# Patient Record
Sex: Female | Born: 1990 | Hispanic: Yes | Marital: Married | Smoking: Current every day smoker
Health system: Southern US, Community
[De-identification: ages and names within clinical notes are randomized; demographics above are authoritative.]

---

## 2016-08-21 ENCOUNTER — Ambulatory Visit (INDEPENDENT_AMBULATORY_CARE_PROVIDER_SITE_OTHER): Payer: Self-pay | Admitting: Physician Assistant

## 2016-08-23 ENCOUNTER — Emergency Department (HOSPITAL_COMMUNITY)
Admission: EM | Admit: 2016-08-23 | Discharge: 2016-08-24 | Disposition: A | Discharge status: Home / Self Care | Payer: Self-pay | Attending: Emergency Medicine | Admitting: Emergency Medicine

## 2016-08-23 ENCOUNTER — Encounter (HOSPITAL_COMMUNITY): Payer: Self-pay | Admitting: Emergency Medicine

## 2016-08-23 DIAGNOSIS — R200 Anesthesia of skin: Secondary | ICD-10-CM

## 2016-08-23 LAB — CBC WITH DIFFERENTIAL/PLATELET
Basophils Absolute: 0 10*3/uL (ref 0.0–0.1)
Basophils Relative: 0 %
EOS ABS: 0.1 10*3/uL (ref 0.0–0.7)
EOS PCT: 1 %
HCT: 36.6 % (ref 36.0–46.0)
HEMOGLOBIN: 11.7 g/dL — AB (ref 12.0–15.0)
Lymphocytes Relative: 43 %
Lymphs Abs: 5.3 10*3/uL — ABNORMAL HIGH (ref 0.7–4.0)
MCH: 26.6 pg (ref 26.0–34.0)
MCHC: 32 g/dL (ref 30.0–36.0)
MCV: 83.2 fL (ref 78.0–100.0)
Monocytes Absolute: 0.7 10*3/uL (ref 0.1–1.0)
Monocytes Relative: 6 %
NEUTROS PCT: 50 %
Neutro Abs: 6.3 10*3/uL (ref 1.7–7.7)
PLATELETS: 385 10*3/uL (ref 150–400)
RBC: 4.4 MIL/uL (ref 3.87–5.11)
RDW: 14.5 % (ref 11.5–15.5)
WBC: 12.4 10*3/uL — AB (ref 4.0–10.5)

## 2016-08-23 LAB — BASIC METABOLIC PANEL
Anion gap: 6 (ref 5–15)
BUN: 7 mg/dL (ref 6–20)
CHLORIDE: 105 mmol/L (ref 101–111)
CO2: 25 mmol/L (ref 22–32)
CREATININE: 0.66 mg/dL (ref 0.44–1.00)
Calcium: 9 mg/dL (ref 8.9–10.3)
GFR calc Af Amer: >60 mL/min (ref >60)
GFR calc non Af Amer: >60 mL/min (ref >60)
Glucose, Bld: 188 mg/dL — ABNORMAL HIGH (ref 65–99)
POTASSIUM: 3.6 mmol/L (ref 3.5–5.1)
SODIUM: 136 mmol/L (ref 135–145)

## 2016-08-23 LAB — URINALYSIS, ROUTINE W REFLEX MICROSCOPIC
BILIRUBIN URINE: NEGATIVE
Glucose, UA: NEGATIVE mg/dL
KETONES UR: NEGATIVE mg/dL
Leukocytes, UA: NEGATIVE
Nitrite: NEGATIVE
PH: 6 (ref 5.0–8.0)
PROTEIN: NEGATIVE mg/dL
Specific Gravity, Urine: 1.023 (ref 1.005–1.030)

## 2016-08-23 LAB — POC URINE PREG, ED: PREG TEST UR: NEGATIVE

## 2016-08-23 LAB — CBG MONITORING, ED: GLUCOSE-CAPILLARY: 116 mg/dL — AB (ref 65–99)

## 2016-08-23 NOTE — ED Triage Notes (Signed)
Pt c/o right side numbness since this morning, denies any pain, no neuro deficit noticed on triage.

## 2016-08-24 NOTE — Discharge Instructions (Signed)

## 2016-08-24 NOTE — ED Provider Notes (Signed)
[facility]-EMERGENCY DEPT Provider Note   CSN: [id number] Arrival date & time: 08/23/16  2050     History   Chief Complaint Chief Complaint  Patient presents with  . Numbness    right side.    HPI Sandra Ingram is a 26 y.o. female.  The history is provided by the patient.  Neurologic Problem  This is a new problem. The current episode started 2 days ago. The problem occurs daily. The problem has been gradually improving. Associated symptoms include headaches. Pertinent negatives include no chest pain, no abdominal pain and no shortness of breath. Nothing aggravates the symptoms. Nothing relieves the symptoms.  pt reports onset of unilateral right sided numbness about 2 days ago It became worse prior to arrival but now is improving She reports it all started in the toes of her right foot and then progressed to rest of leg/right arm/face She has had mild HA No acute visual changes No fever/vomiting She has felt some weakness in extremities, none at this time Family reports earlier her voice "was lower" and her face may have been assymetric but now all resolved No h/o stroke She reports mild low back pain No other medical conditions   PMH - none OB History    No data available       Home Medications    Prior to Admission medications   Not on File    Family History History reviewed. No pertinent family history.  Social History Social History  Substance Use Topics  . Smoking status: Never Smoker  . Smokeless tobacco: Never Used  . Alcohol use No     Allergies   Patient has no known allergies.   Review of Systems Review of Systems  Constitutional: Negative for fever.  Respiratory: Negative for shortness of breath.   Cardiovascular: Negative for chest pain.  Gastrointestinal: Negative for abdominal pain.  Neurological: Positive for weakness, numbness and headaches.  All other systems reviewed and are negative.    Physical Exam Updated Vital Signs BP  121/80   Pulse (!) 102   Temp 97.8 F (36.6 C) (Oral)   Resp 18   Ht 1.626 m (5\' 4" )   Wt 107.7 kg (237 lb 8 oz)   LMP 05/14/2016   SpO2 98%   BMI 40.77 kg/m   Physical Exam CONSTITUTIONAL: Well developed/well nourished HEAD: Normocephalic/atraumatic EYES: EOMI/PERRL, no nystagmus ENMT: Mucous membranes moist NECK: supple no meningeal signs, no bruits CV: S1/S2 noted, no murmurs/rubs/gallops noted LUNGS: Lungs are clear to auscultation bilaterally, no apparent distress ABDOMEN: soft, nontender, no rebound or guarding GU:no cva tenderness NEURO:Awake/alert, face symmetric, no arm or leg drift is noted Equal 5/5 strength with hip flexion,knee flex/extension, foot dorsi/plantar flexion Cranial nerves 3/4/5/6/07/30/08/11/12 tested and intact Gait normal without ataxia No past pointing Sensation to light touch intact in all extremities EXTREMITIES: pulses normal, full ROM SKIN: warm, color normal PSYCH: no abnormalities of mood noted   ED Treatments / Results  Labs (all labs ordered are listed, but only abnormal results are displayed) Labs Reviewed  CBC WITH DIFFERENTIAL/PLATELET - Abnormal; Notable for the following:       Result Value   WBC 12.4 (*)    Hemoglobin 11.7 (*)    Lymphs Abs 5.3 (*)    All other components within normal limits  BASIC METABOLIC PANEL - Abnormal; Notable for the following:    Glucose, Bld 188 (*)    All other components within normal limits  URINALYSIS, ROUTINE W REFLEX MICROSCOPIC - Abnormal;  Notable for the following:    APPearance HAZY (*)    Hgb urine dipstick SMALL (*)    Bacteria, UA RARE (*)    Squamous Epithelial / LPF 0-5 (*)    All other components within normal limits  CBG MONITORING, ED - Abnormal; Notable for the following:    Glucose-Capillary 116 (*)    All other components within normal limits  POC URINE PREG, ED    EKG  EKG Interpretation  Date/Time:  Thursday August 23 2016 23:24:52 EDT Ventricular Rate:  92 PR  Interval:    QRS Duration: 74 QT Interval:  334 QTC Calculation: 414 R Axis:   61 Text Interpretation:  Sinus rhythm Low voltage, precordial leads Abnormal Q suggests anterior infarct Interpretation limited secondary to artifact No previous ECGs available Confirmed by Zadie RhineWickline, Ileene Allie ([id number]) on 08/24/2016 12:23:23 AM       Radiology No results found.  Procedures Procedures    Medications Ordered in ED Medications - No data to display   Initial Impression / Assessment and Plan / ED Course  I have reviewed the triage vital signs and the nursing notes.  Pertinent labs   results that were available during my care of the patient were reviewed by me and considered in my medical decision making (see chart for details).     Pt stable No focal neuro deficits on my evaluation However, reported h/o numbness/?facial weakness and speech changes over past 2 days I advised to fully rule out stroke, we should proceed with MRI brain She declines and is requesting discharge home She reports she will come back to the ED later in the day for evaluation She was also referred to neurology as outpatient We discussed strict ER return precautions  Final Clinical Impressions(s) / ED Diagnoses   Final diagnoses:  Numbness    New Prescriptions There are no discharge medications for this patient.    Zadie RhineWickline, Kali Ambler, MD 08/24/16 [phone]

## 2016-08-24 NOTE — ED Notes (Signed)
Pt departed in NAD, refused use of wheelchair.  

## 2016-09-11 ENCOUNTER — Ambulatory Visit (INDEPENDENT_AMBULATORY_CARE_PROVIDER_SITE_OTHER): Payer: Self-pay | Admitting: Physician Assistant

## 2016-09-11 ENCOUNTER — Encounter (INDEPENDENT_AMBULATORY_CARE_PROVIDER_SITE_OTHER): Payer: Self-pay | Admitting: Physician Assistant

## 2016-09-11 VITALS — BP 100/69 | HR 93 | Temp 98.2°F | Resp 18 | Ht 65.0 in | Wt 240.0 lb

## 2016-09-11 DIAGNOSIS — L680 Hirsutism: Secondary | ICD-10-CM

## 2016-09-11 DIAGNOSIS — N921 Excessive and frequent menstruation with irregular cycle: Secondary | ICD-10-CM

## 2016-09-11 DIAGNOSIS — R202 Paresthesia of skin: Secondary | ICD-10-CM

## 2016-09-11 DIAGNOSIS — E669 Obesity, unspecified: Secondary | ICD-10-CM

## 2016-09-11 DIAGNOSIS — D72829 Elevated white blood cell count, unspecified: Secondary | ICD-10-CM

## 2016-09-11 DIAGNOSIS — D649 Anemia, unspecified: Secondary | ICD-10-CM

## 2016-09-11 DIAGNOSIS — R739 Hyperglycemia, unspecified: Secondary | ICD-10-CM

## 2016-09-11 DIAGNOSIS — R7303 Prediabetes: Secondary | ICD-10-CM

## 2016-09-11 LAB — POCT URINALYSIS DIPSTICK
Bilirubin, UA: NEGATIVE
Glucose, UA: NEGATIVE
KETONES UA: NEGATIVE
Leukocytes, UA: NEGATIVE
Nitrite, UA: NEGATIVE
PH UA: 6 (ref 5.0–8.0)
SPEC GRAV UA: 1.025 (ref 1.010–1.025)
UROBILINOGEN UA: 0.2 U/dL

## 2016-09-11 LAB — POCT GLYCOSYLATED HEMOGLOBIN (HGB A1C): Hemoglobin A1C: 6.1

## 2016-09-11 LAB — POCT URINE PREGNANCY: PREG TEST UR: NEGATIVE

## 2016-09-11 MED ORDER — METFORMIN HCL 500 MG PO TABS: 500.0000 mg | ORAL_TABLET | Freq: Two times a day (BID) | ORAL | 5 refills | Status: AC

## 2016-09-11 MED ORDER — ASPIRIN EC 81 MG PO TBEC: 81.0000 mg | DELAYED_RELEASE_TABLET | Freq: Every day | ORAL | 3 refills | Status: DC

## 2016-09-11 NOTE — Progress Notes (Signed)
Subjective:  Patient ID: Sandra Ingram, female    DOB: [birth year]  Age: 26 y.o. MRN: [id number]  CC: establish care  HPI Sandra Ingram is a 26 y.o. female with no significant medical history presents to establish care.. Says she went to the ED on 08/23/16 for right sided numbness and right sided facial droop. Paresthesia began at the right foot while resting in bed. Paresthesia then slowly travelled superiorly to involve the face. Had associated speech disturbance and incoherent speech. Whole episode lasted approximately 2 hours. Pt currently taking aspirin 81 mg daily. Does not have continued paresthesia or any residual neurological deficits. Does not endorse CP, palpitations, SOB, HA, abdominal pain, weakness, abnormal coordination, AMS, speech disturbances, f/c/n/v, swelling, rash, or GI/GU sxs. Has not used any type of birth control for "many years".    ROS Review of Systems  Constitutional: Negative for chills, fever and malaise/fatigue.  Eyes: Negative for blurred vision.  Respiratory: Negative for shortness of breath.   Cardiovascular: Negative for chest pain and palpitations.  Gastrointestinal: Negative for abdominal pain and nausea.  Genitourinary: Negative for dysuria and hematuria.       Menometrorrhagia  Musculoskeletal: Negative for joint pain and myalgias.  Skin: Negative for rash.  Neurological: Positive for tingling and sensory change. Negative for headaches.  Psychiatric/Behavioral: Negative for depression. The patient is not nervous/anxious.     Objective:  Resp 18   Ht 5\' 5"  (1.651 m)   Wt 240 lb (108.9 kg)   LMP 06/20/2016 Comment: irregular  BMI 39.94 kg/m   BP/Weight 09/11/2016 08/24/2016 08/23/2016  Systolic BP - 121 -  Diastolic BP - 80 -  Wt. (Lbs) 240 - 237.5  BMI 39.94 - 40.77      Physical Exam  Constitutional: She is oriented to person, place, and time.  Well developed, obese, NAD, polite  HENT:  Head: Normocephalic and atraumatic.  Eyes:  Pupils are equal, round, and reactive to light. Conjunctivae and EOM are normal. No scleral icterus.  Neck: Normal range of motion. Neck supple. No thyromegaly present.  Cardiovascular: Normal rate, regular rhythm and normal heart sounds.   Pulmonary/Chest: Effort normal and breath sounds normal.  Abdominal: Soft. Bowel sounds are normal. There is no tenderness.  Musculoskeletal: She exhibits no edema.  Neurological: She is alert and oriented to person, place, and time. No cranial nerve deficit. Coordination normal.  Light touch sensation intact, strength 5/5 throughout, patellar DTR 1+ bilaterally, tandem walking intact, nose to finger normal bilaterally, Romberg negative. No gross cognitive deficit noted.   Skin: Skin is warm and dry. No rash noted. No erythema. No pallor.  Some hirsutism under the chin  Psychiatric: She has a normal mood and affect. Her behavior is normal. Thought content normal.  Vitals reviewed.    Assessment & Plan:   1. Paresthesia - TSH - CT Head Wo Contrast; Future  2. Prediabetes - Begin metFORMIN (GLUCOPHAGE) 500 MG tablet; Take 1 tablet (500 mg total) by mouth 2 (two) times daily with a meal.  Dispense: 60 tablet; Refill: 5  3. Hyperglycemia - HgB A1c 6.1% in clinic today. - Comprehensive metabolic panel  4. Anemia, unspecified type - CBC with Differential  5. Leukocytosis, unspecified type - Sedimentation Rate - C-reactive protein  6. Menometrorrhagia - US Pelvis Complete; Future - US Transvaginal Non-OB; Future - metFORMIN (GLUCOPHAGE) 500 MG tablet; Take 1 tablet (500 mg total) by mouth 2 (two) times daily with a meal.  Dispense: 60 tablet; Refill: 5  7. Obesity,  unspecified classification, unspecified obesity type, unspecified whether serious comorbidity present - Comprehensive metabolic panel - TSH - Begin metFORMIN (GLUCOPHAGE) 500 MG tablet; Take 1 tablet (500 mg total) by mouth 2 (two) times daily with a meal.  Dispense: 60 tablet;  Refill: 5  8. Hirsutism - Begin metFORMIN (GLUCOPHAGE) 500 MG tablet; Take 1 tablet (500 mg total) by mouth 2 (two) times daily with a meal.  Dispense: 60 tablet; Refill: 5   Meds ordered this encounter  Medications  . aspirin EC 81 MG tablet    Sig: Take 1 tablet (81 mg total) by mouth daily.    Dispense:  90 tablet    Refill:  3    Order Specific Question:   Supervising Provider    Answer:   Quentin Angst [id number]  . metFORMIN (GLUCOPHAGE) 500 MG tablet    Sig: Take 1 tablet (500 mg total) by mouth 2 (two) times daily with a meal.    Dispense:  60 tablet    Refill:  5    Order Specific Question:   Supervising Provider    Answer:   Quentin Angst [id number]    Follow-up: Return in about 3 months (around 12/12/2016) for menstrual irregularity.   Loletta Specter PA

## 2016-09-11 NOTE — Patient Instructions (Addendum)
Paresthesia Paresthesia is an abnormal burning or prickling sensation. This sensation is generally felt in the hands, arms, legs, or feet. However, it may occur in any part of the body. Usually, it is not painful. The feeling may be described as:  Tingling or numbness.  Pins and needles.  Skin crawling.  Buzzing.  Limbs falling asleep.  Itching.  Most people experience temporary (transient) paresthesia at some time in their lives. Paresthesia may occur when you breathe too quickly (hyperventilation). It can also occur without any apparent cause. Commonly, paresthesia occurs when pressure is placed on a nerve. The sensation quickly goes away after the pressure is removed. For some people, however, paresthesia is a long-lasting (chronic) condition that is caused by an underlying disorder. If you continue to have paresthesia, you may need further medical evaluation. Follow these instructions at home: Watch your condition for any changes. Taking the following actions may help to lessen any discomfort that you are feeling:  Avoid drinking alcohol.  Try acupuncture or massage to help relieve your symptoms.  Keep all follow-up visits as directed by your health care provider. This is important.  Contact a health care provider if:  You continue to have episodes of paresthesia.  Your burning or prickling feeling gets worse when you walk.  You have pain, cramps, or dizziness.  You develop a rash. Get help right away if:  You feel weak.  You have trouble walking or moving.  You have problems with speech, understanding, or vision.  You feel confused.  You cannot control your bladder or bowel movements.  You have numbness after an injury.  You faint. This information is not intended to replace advice given to you by your health care provider. Make sure you discuss any questions you have with your health care provider. Document Released: 12/29/2001 Document Revised: 06/16/2015  Document Reviewed: 01/04/2014 Elsevier Interactive Patient Education  2018 [employer].  Prediabetes Prediabetes is the condition of having a blood sugar (blood glucose) level that is higher than it should be, but not high enough for you to be diagnosed with type 2 diabetes. Having prediabetes puts you at risk for developing type 2 diabetes (type 2 diabetes mellitus). Prediabetes may be called impaired glucose tolerance or impaired fasting glucose. Prediabetes usually does not cause symptoms. Your health care provider can diagnose this condition with blood tests. You may be tested for prediabetes if you are overweight and if you have at least one other risk factor for prediabetes. Risk factors for prediabetes include:  Having a family member with type 2 diabetes.  Being overweight or obese.  Being older than age 80.  Being of American-Indian, African-American, Hispanic/Latino, or Asian/Pacific Islander descent.  Having an inactive (sedentary) lifestyle.  Having a history of gestational diabetes or polycystic ovarian syndrome (PCOS).  Having low levels of good cholesterol (HDL-C) or high levels of blood fats (triglycerides).  Having high blood pressure.  What is blood glucose and how is blood glucose measured?  Blood glucose refers to the amount of glucose in your bloodstream. Glucose comes from eating foods that contain sugars and starches (carbohydrates) that the body breaks down into glucose. Your blood glucose level may be measured in mg/dL (milligrams per deciliter) or mmol/L (millimoles per liter).Your blood glucose may be checked with one or more of the following blood tests:  A fasting blood glucose (FBG) test. You will not be allowed to eat (you will fast) for at least 8 hours before a blood sample is taken. ?  A normal range for FBG is 70-100 mg/dl (2.4-2.3 mmol/L).  An A1c (hemoglobin A1c) blood test. This test provides information about blood glucose control over the  previous 2?72months.  An oral glucose tolerance test (OGTT). This test measures your blood glucose twice: ? After fasting. This is your baseline level. ? Two hours after you drink a beverage that contains glucose.  You may be diagnosed with prediabetes:  If your FBG is 100?125 mg/dL (5.3-6.1 mmol/L).  If your A1c level is 5.7?6.4%.  If your OGGT result is 140?199 mg/dL (4.4-31 mmol/L).  These blood tests may be repeated to confirm your diagnosis. What happens if blood glucose is too high? The pancreas produces a hormone (insulin) that helps move glucose from the bloodstream into cells. When cells in the body do not respond properly to insulin that the body makes (insulin resistance), excess glucose builds up in the blood instead of going into cells. As a result, high blood glucose (hyperglycemia) can develop, which can cause many complications. This is a symptom of prediabetes. What can happen if blood glucose stays higher than normal for a long time? Having high blood glucose for a long time is dangerous. Too much glucose in your blood can damage your nerves and blood vessels. Long-term damage can lead to complications from diabetes, which may include:  Heart disease.  Stroke.  Blindness.  Kidney disease.  Depression.  Poor circulation in the feet and legs, which could lead to surgical removal (amputation) in severe cases.  How can prediabetes be prevented from turning into type 2 diabetes?  To help prevent type 2 diabetes, take the following actions:  Be physically active. ? Do moderate-intensity physical activity for at least 30 minutes on at least 5 days of the week, or as much as told by your health care provider. This could be brisk walking, biking, or water aerobics. ? Ask your health care provider what activities are safe for you. A mix of physical activities may be best, such as walking, swimming, cycling, and strength training.  Lose weight as told by your health  care provider. ? Losing 5-7% of your body weight can reverse insulin resistance. ? Your health care provider can determine how much weight loss is best for you and can help you lose weight safely.  Follow a healthy meal plan. This includes eating lean proteins, complex carbohydrates, fresh fruits and vegetables, low-fat dairy products, and healthy fats. ? Follow instructions from your health care provider about eating or drinking restrictions. ? Make an appointment to see a diet and nutrition specialist (registered dietitian) to help you create a healthy eating plan that is right for you.  Do not smoke or use any tobacco products, such as cigarettes, chewing tobacco, and e-cigarettes. If you need help quitting, ask your health care provider.  Take over-the-counter and prescription medicines as told by your health care provider. You may be prescribed medicines that help lower the risk of type 2 diabetes.  This information is not intended to replace advice given to you by your health care provider. Make sure you discuss any questions you have with your health care provider. Document Released: 05/02/2015 Document Revised: 06/16/2015 Document Reviewed: 03/01/2015 Elsevier Interactive Patient Education  [employer].

## 2016-09-12 LAB — COMPREHENSIVE METABOLIC PANEL
ALBUMIN: 4.1 g/dL (ref 3.5–5.5)
ALK PHOS: 81 IU/L (ref 39–117)
ALT: 10 IU/L (ref 0–32)
AST: 14 IU/L (ref 0–40)
Albumin/Globulin Ratio: 1.5 (ref 1.2–2.2)
BUN / CREAT RATIO: 13 (ref 9–23)
BUN: 8 mg/dL (ref 6–20)
Bilirubin Total: 0.3 mg/dL (ref 0.0–1.2)
CALCIUM: 8.9 mg/dL (ref 8.7–10.2)
CO2: 21 mmol/L (ref 20–29)
CREATININE: 0.61 mg/dL (ref 0.57–1.00)
Chloride: 100 mmol/L (ref 96–106)
GFR calc Af Amer: 147 mL/min/{1.73_m2} (ref >59)
GFR, EST NON AFRICAN AMERICAN: 127 mL/min/{1.73_m2} (ref >59)
GLOBULIN, TOTAL: 2.8 g/dL (ref 1.5–4.5)
Glucose: 142 mg/dL — ABNORMAL HIGH (ref 65–99)
Potassium: 3.8 mmol/L (ref 3.5–5.2)
SODIUM: 137 mmol/L (ref 134–144)
TOTAL PROTEIN: 6.9 g/dL (ref 6.0–8.5)

## 2016-09-12 LAB — CBC WITH DIFFERENTIAL/PLATELET
BASOS: 0 %
Basophils Absolute: 0 10*3/uL (ref 0.0–0.2)
EOS (ABSOLUTE): 0 10*3/uL (ref 0.0–0.4)
EOS: 0 %
HEMATOCRIT: 38.1 % (ref 34.0–46.6)
HEMOGLOBIN: 12.2 g/dL (ref 11.1–15.9)
Immature Grans (Abs): 0.1 10*3/uL (ref 0.0–0.1)
Immature Granulocytes: 1 %
LYMPHS ABS: 3 10*3/uL (ref 0.7–3.1)
Lymphs: 28 %
MCH: 26.3 pg — ABNORMAL LOW (ref 26.6–33.0)
MCHC: 32 g/dL (ref 31.5–35.7)
MCV: 82 fL (ref 79–97)
MONOS ABS: 0.3 10*3/uL (ref 0.1–0.9)
Monocytes: 3 %
NEUTROS ABS: 7.4 10*3/uL — AB (ref 1.4–7.0)
Neutrophils: 68 %
Platelets: 359 10*3/uL (ref 150–379)
RBC: 4.63 x10E6/uL (ref 3.77–5.28)
RDW: 14.9 % (ref 12.3–15.4)
WBC: 10.7 10*3/uL (ref 3.4–10.8)

## 2016-09-12 LAB — TSH: TSH: 1.71 u[IU]/mL (ref 0.450–4.500)

## 2016-09-12 LAB — SEDIMENTATION RATE: Sed Rate: 46 mm/hr — ABNORMAL HIGH (ref 0–32)

## 2016-09-12 LAB — C-REACTIVE PROTEIN: CRP: 18.4 mg/L — ABNORMAL HIGH (ref 0.0–4.9)

## 2016-09-13 ENCOUNTER — Encounter (INDEPENDENT_AMBULATORY_CARE_PROVIDER_SITE_OTHER): Payer: Self-pay

## 2016-09-14 ENCOUNTER — Ambulatory Visit (HOSPITAL_COMMUNITY)
Admission: RE | Admit: 2016-09-14 | Discharge: 2016-09-14 | Disposition: A | Discharge status: Home / Self Care | Payer: Medicaid Other | Source: Ambulatory Visit | Attending: Physician Assistant | Admitting: Physician Assistant

## 2016-09-14 ENCOUNTER — Encounter (HOSPITAL_COMMUNITY): Payer: Self-pay

## 2016-09-14 DIAGNOSIS — N888 Other specified noninflammatory disorders of cervix uteri: Secondary | ICD-10-CM | POA: Insufficient documentation

## 2016-09-14 DIAGNOSIS — N921 Excessive and frequent menstruation with irregular cycle: Secondary | ICD-10-CM | POA: Insufficient documentation

## 2016-09-14 DIAGNOSIS — R202 Paresthesia of skin: Secondary | ICD-10-CM

## 2016-12-12 ENCOUNTER — Encounter (INDEPENDENT_AMBULATORY_CARE_PROVIDER_SITE_OTHER): Payer: Self-pay | Admitting: Physician Assistant

## 2016-12-12 ENCOUNTER — Ambulatory Visit (INDEPENDENT_AMBULATORY_CARE_PROVIDER_SITE_OTHER): Payer: Medicaid Other | Admitting: Physician Assistant

## 2016-12-12 VITALS — BP 111/77 | HR 83 | Temp 98.0°F | Resp 14 | Ht 65.0 in | Wt 228.6 lb

## 2016-12-12 DIAGNOSIS — M545 Low back pain, unspecified: Secondary | ICD-10-CM

## 2016-12-12 DIAGNOSIS — G8929 Other chronic pain: Secondary | ICD-10-CM

## 2016-12-12 DIAGNOSIS — N921 Excessive and frequent menstruation with irregular cycle: Secondary | ICD-10-CM

## 2016-12-12 MED ORDER — CYCLOBENZAPRINE HCL 10 MG PO TABS: 10.0000 mg | ORAL_TABLET | Freq: Two times a day (BID) | ORAL | 0 refills | Status: DC | PRN

## 2016-12-12 MED ORDER — NAPROXEN 500 MG PO TABS: 500.0000 mg | ORAL_TABLET | Freq: Two times a day (BID) | ORAL | 0 refills | Status: DC
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
## 2016-12-12 NOTE — Patient Instructions (Signed)
Muscle Strain A muscle strain (pulled muscle) happens when a muscle is stretched beyond normal length. It happens when a sudden, violent force stretches your muscle too far. Usually, a few of the fibers in your muscle are torn. Muscle strain is common in athletes. Recovery usually takes 1-2 weeks. Complete healing takes 5-6 weeks. Follow these instructions at home:  Follow the PRICE method of treatment to help your injury get better. Do this the first 2-3 days after the injury: ? Protect. Protect the muscle to keep it from getting injured again. ? Rest. Limit your activity and rest the injured body part. ? Ice. Put ice in a plastic bag. Place a towel between your skin and the bag. Then, apply the ice and leave it on from 15-20 minutes each hour. After the third day, switch to moist heat packs. ? Compression. Use a splint or elastic bandage on the injured area for comfort. Do not put it on too tightly. ? Elevate. Keep the injured body part above the level of your heart.  Only take medicine as told by your doctor.  Warm up before doing exercise to prevent future muscle strains. Contact a doctor if:  You have more pain or puffiness (swelling) in the injured area.  You feel numbness, tingling, or notice a loss of strength in the injured area. This information is not intended to replace advice given to you by your health care provider. Make sure you discuss any questions you have with your health care provider. Document Released: 10/18/2007 Document Revised: 06/16/2015 Document Reviewed: 08/07/2012 Elsevier Interactive Patient Education  2017 [employer]  

## 2016-12-26 ENCOUNTER — Other Ambulatory Visit (INDEPENDENT_AMBULATORY_CARE_PROVIDER_SITE_OTHER): Payer: Self-pay

## 2017-04-24 ENCOUNTER — Other Ambulatory Visit: Payer: Self-pay

## 2017-04-24 ENCOUNTER — Ambulatory Visit (INDEPENDENT_AMBULATORY_CARE_PROVIDER_SITE_OTHER): Payer: Self-pay | Admitting: Physician Assistant

## 2017-04-24 ENCOUNTER — Encounter (INDEPENDENT_AMBULATORY_CARE_PROVIDER_SITE_OTHER): Payer: Self-pay | Admitting: Physician Assistant

## 2017-04-24 VITALS — BP 109/72 | HR 77 | Temp 97.7°F | Ht 65.0 in | Wt 194.2 lb

## 2017-04-24 DIAGNOSIS — L209 Atopic dermatitis, unspecified: Secondary | ICD-10-CM

## 2017-04-24 DIAGNOSIS — T7840XA Allergy, unspecified, initial encounter: Secondary | ICD-10-CM

## 2017-04-24 MED ORDER — LEVOCETIRIZINE DIHYDROCHLORIDE 5 MG PO TABS: 5.0000 mg | ORAL_TABLET | Freq: Every evening | ORAL | 0 refills | Status: DC

## 2017-04-24 MED ORDER — CETAPHIL EX CREA: TOPICAL_CREAM | CUTANEOUS | 0 refills | Status: DC | PRN

## 2017-04-24 MED ORDER — TRIAMCINOLONE ACETONIDE 0.1 % EX CREA: 1.0000 "application " | TOPICAL_CREAM | Freq: Two times a day (BID) | CUTANEOUS | 0 refills | Status: DC

## 2017-04-24 MED ORDER — ALBUTEROL SULFATE HFA 108 (90 BASE) MCG/ACT IN AERS: 2.0000 | INHALATION_SPRAY | RESPIRATORY_TRACT | 5 refills | Status: DC | PRN

## 2017-04-24 NOTE — Progress Notes (Signed)
Subjective:  Patient ID: Sandra Ingram, female    DOB: 02-Apr-1990  Age: 27 y.o. MRN: [id number]  CC: f/u anemia  HPI Sandra Ingram is a 28 y.o. female with a medical history of paresthesia, prediabetes, anemia, menometrorrhagia, hirsutism, and obesity presents with concern for allergies. Has redness and dryness in the hands. Sneezing, coughing, chest tightness, and occasional wheeze. 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 Also complains of hand dryness with tiny pruritic bumps on her hands. Has not taken any medications or remedies for these symptoms. Does not endorse swelling, abdominal pain, f/c/n/v, CP, or GI/GU sxs.         Outpatient Medications Prior to Visit  Medication Sig Dispense Refill  . metFORMIN (GLUCOPHAGE) 500 MG tablet Take 1 tablet (500 mg total) by mouth 2 (two) times daily with a meal. (Patient not taking: Reported on 04/24/2017) 60 tablet 5  . aspirin EC 81 MG tablet Take 1 tablet (81 mg total) by mouth daily. 90 tablet 3  . cyclobenzaprine (FLEXERIL) 10 MG tablet Take 1 tablet (10 mg total) by mouth 2 (two) times daily as needed for muscle spasms. 30 tablet 0  . naproxen (NAPROSYN) 500 MG tablet Take 1 tablet (500 mg total) by mouth 2 (two) times daily with a meal. 30 tablet 0   No facility-administered medications prior to visit.      ROS Review of Systems  Constitutional: Negative for chills, fever and malaise/fatigue.  Eyes: Negative for blurred vision.  Respiratory: Positive for cough and wheezing. Negative for shortness of breath.   Cardiovascular: Negative for chest pain and palpitations.  Gastrointestinal: Negative for abdominal pain and nausea.  Genitourinary: Negative for dysuria and hematuria.  Musculoskeletal: Negative for joint pain and myalgias.  Skin: Positive for rash.  Neurological: Negative for tingling and headaches.  Psychiatric/Behavioral: Negative for depression. The patient is not nervous/anxious.      Objective:  BP 109/72 (BP Location: Left Arm, Patient Position: Sitting, Cuff Size: Normal)   Pulse 77   Temp 97.7 F (36.5 C) (Oral)   Ht 5\' 5"  (1.651 m)   Wt 194 lb 3.2 oz (88.1 kg)   LMP 04/21/2017 (Exact Date)   SpO2 96%   BMI 32.32 kg/m   BP/Weight 04/24/2017 12/12/2016 09/11/2016  Systolic BP 109 111 100  Diastolic BP 72 77 69  Wt. (Lbs) 194.2 228.6 240  BMI 32.32 38.04 39.94      Physical Exam  Constitutional: She is oriented to person, place, and time.  Well developed, well nourished, NAD, polite  HENT:  Head: Normocephalic and atraumatic.  Eyes: No scleral icterus.  Neck: Normal range of motion. Neck supple. No thyromegaly present.  Cardiovascular: Normal rate, regular rhythm and normal heart sounds.  Pulmonary/Chest: Effort normal and breath sounds normal. No respiratory distress. She has no wheezes. She has no rales.  Musculoskeletal: She exhibits no edema.  Neurological: She is alert and oriented to person, place, and time. No cranial nerve deficit. Coordination normal.  Skin: Skin is warm and dry. No rash noted. No erythema. No pallor.  Small area of erythema without papules, scaling or crusting on the base of the left fourth finger. Mild general dryness of the hands.  Psychiatric: She has a normal mood and affect. Her behavior is normal. Thought content normal.  Vitals reviewed.    Assessment & Plan:    1. Atopic dermatitis, unspecified type - triamcinolone cream (KENALOG) 0.1 %; Apply 1 application topically 2 (two)  times daily.  Dispense: 30 g; Refill: 0 - cetaphil (CETAPHIL) cream; Apply topically as needed.  Dispense: 454 g; Refill: 0  2. Allergic reaction, initial encounter - levocetirizine (XYZAL) 5 MG tablet; Take 1 tablet (5 mg total) by mouth every evening.  Dispense: 30 tablet; Refill: 0 - albuterol (PROVENTIL HFA;VENTOLIN HFA) 108 (90 Base) MCG/ACT inhaler; Inhale 2 puffs into the lungs every 4 (four) hours as needed for wheezing or  shortness of breath.  Dispense: 1 Inhaler; Refill: 5   Meds ordered this encounter  Medications  . DISCONTD: levocetirizine (XYZAL) 5 MG tablet    Sig: Take 1 tablet (5 mg total) by mouth every evening.    Dispense:  30 tablet    Refill:  0    Order Specific Question:   Supervising Provider    Answer:   Quentin AngstJEGEDE, OLUGBEMIGA E [id number]  . DISCONTD: triamcinolone cream (KENALOG) 0.1 %    Sig: Apply 1 application topically 2 (two) times daily.    Dispense:  30 g    Refill:  0    Order Specific Question:   Supervising Provider    Answer:   Quentin AngstJEGEDE, OLUGBEMIGA E [id number]]  . DISCONTD: cetaphil (CETAPHIL) cream    Sig: Apply topically as needed.    Dispense:  454 g    Refill:  0    Order Specific Question:   Supervising Provider    Answer:   Quentin AngstJEGEDE, OLUGBEMIGA E [id number]]  . DISCONTD: albuterol (PROVENTIL HFA;VENTOLIN HFA) 108 (90 Base) MCG/ACT inhaler    Sig: Inhale 2 puffs into the lungs every 4 (four) hours as needed for wheezing or shortness of breath.    Dispense:  1 Inhaler    Refill:  5    Order Specific Question:   Supervising Provider    Answer:   Quentin AngstJEGEDE, OLUGBEMIGA E [id number]]  . DISCONTD: levocetirizine (XYZAL) 5 MG tablet    Sig: Take 1 tablet (5 mg total) by mouth every evening.    Dispense:  30 tablet    Refill:  0    Order Specific Question:   Supervising Provider    Answer:   Quentin AngstJEGEDE, OLUGBEMIGA E [id number]]  . DISCONTD: cetaphil (CETAPHIL) cream    Sig: Apply topically as needed.    Dispense:  454 g    Refill:  0    Order Specific Question:   Supervising Provider    Answer:   Quentin AngstJEGEDE, OLUGBEMIGA E [id number]]  . DISCONTD: albuterol (PROVENTIL HFA;VENTOLIN HFA) 108 (90 Base) MCG/ACT inhaler    Sig: Inhale 2 puffs into the lungs every 4 (four) hours as needed for wheezing or shortness of breath.    Dispense:  1 Inhaler    Refill:  5    Order Specific Question:   Supervising Provider    Answer:   Quentin AngstJEGEDE, OLUGBEMIGA E [id number]]  . DISCONTD: triamcinolone cream (KENALOG)  0.1 %    Sig: Apply 1 application topically 2 (two) times daily.    Dispense:  30 g    Refill:  0    Order Specific Question:   Supervising Provider    Answer:   Quentin AngstJEGEDE, OLUGBEMIGA E [id number]]  . triamcinolone cream (KENALOG) 0.1 %    Sig: Apply 1 application topically 2 (two) times daily.    Dispense:  30 g    Refill:  0    Order Specific Question:   Supervising Provider    Answer:   Quentin AngstJEGEDE, OLUGBEMIGA E [id number]]  . levocetirizine (XYZAL) 5 MG tablet  Sig: Take 1 tablet (5 mg total) by mouth every evening.    Dispense:  30 tablet    Refill:  0    Order Specific Question:   Supervising Provider    Answer:   Quentin Angst [id number]  . cetaphil (CETAPHIL) cream    Sig: Apply topically as needed.    Dispense:  454 g    Refill:  0    Order Specific Question:   Supervising Provider    Answer:   Quentin Angst [id number]  . albuterol (PROVENTIL HFA;VENTOLIN HFA) 108 (90 Base) MCG/ACT inhaler    Sig: Inhale 2 puffs into the lungs every 4 (four) hours as needed for wheezing or shortness of breath.    Dispense:  1 Inhaler    Refill:  5    Order Specific Question:   Supervising Provider    Answer:   Quentin Angst [id number]    Follow-up: Return if symptoms worsen or fail to improve.   Loletta Specter PA

## 2017-04-24 NOTE — Patient Instructions (Signed)
Dermatitis atpica  Atopic Dermatitis  La dermatitis atpica es un trastorno de la piel que causa inflamacin. Es el tipo ms frecuente de eczema. El eczema es un grupo de afecciones de la piel que causan picazn, enrojecimiento e hinchazn. Esta afeccin, generalmente, empeora durante los meses fros del invierno y suele mejorar durante los meses clidos del verano. Los sntomas pueden variar de una persona a otra.  La dermatitis atpica, normalmente, comienza a manifestarse en la infancia y puede durar hasta la adultez. Esta afeccin no puede transmitirse de una persona a otra (no es contagiosa), pero es ms comn en las familias. Es posible que la dermatitis atpica no siempre sea visible. Cuando es visible, se habla de un brote.  Cules son las causas?  Se desconoce la causa exacta de esta afeccin. Algunos factores desencadenantes de los brotes pueden ser los siguientes:   Contacto con alguna cosa a la que es sensible o alrgico.   Estrs.   Ciertos alimentos.   Clima extremadamente clido o fro.   Jabones y sustancias qumicas fuertes.   Aire seco.   Cloro.    Qu incrementa el riesgo?  Esta afeccin es ms probable que ocurra en personas que tienen antecedentes personales o familiares de eczema, alergias, asma o fiebre del heno.  Cules son los signos o los sntomas?  Los sntomas de esta afeccin incluyen los siguientes:   Piel seca y escamosa.   Erupcin roja y que pica.   Picazn, que puede ser muy intensa. Puede ocurrir antes de la erupcin en la piel. Esto puede dificultar el sueo.   Engrosamiento y agrietamiento de la piel que pueden producirse con el tiempo.    Cmo se diagnostica?  Esta afeccin se diagnostica en funcin de los sntomas, los antecedentes mdicos y un examen fsico.  Cmo se trata?  No hay cura para esta afeccin, pero los sntomas, normalmente, se pueden controlar. El tratamiento se centra en lo siguiente:   Controlar la picazn y el rascado. Probablemente, le  receten medicamentos, como antihistamnicos o cremas corticoesteroides.   Limitar la exposicin a las cosas a las que es sensible o alrgico (alrgenos).   Reconocer situaciones que causan estrs e idear un plan para controlarlo.    Si la dermatitis atpica no mejora con medicamentos o si est presente en todo el cuerpo (diseminada), puede utilizarse un tratamiento con un tipo de luz especfico (fototerapia).  Siga estas indicaciones en su casa:  Cuidado de la piel   Mantenga la piel bien humectada. Al hacerlo, quedar hmeda y ayudar a prevenir la sequedad.  ? Utilice lociones sin perfume que contengan vaselina.  ? Evite las lociones que contienen alcohol o agua. Pueden secar la piel.   Tome baos o duchas de corta duracin (menos de 5 minutos) en agua tibia. No use agua caliente.  ? Use jabones suaves y sin perfume para baarse. Evite el jabn y el bao de espuma.  ? Aplique un humectante para la piel inmediatamente despus de un bao o una ducha.   No aplique nada sobre la piel sin consultar a su mdico.  Instrucciones generales   Vstase con ropa de algodn o mezcla de algodn. Vstase con ropas ligeras, ya que el calor aumenta la picazn.   Cuando lave la ropa, enjuguela dos veces para eliminar todo el jabn.   Evite cualquier factor desencadenante que pueda causar un brote.   Intente manejar el estrs.   Mantenga las uas cortas.   Evite rascarse. El rascado hace   que la erupcin y la picazn empeoren. Tambin puede producir una infeccin en la piel (imptigo) debido a las lesiones cutneas causadas por el rascado.   Tome o aplquese los medicamentos de venta libre y recetados solamente como se lo haya indicado el mdico.   Concurra a todas las visitas de seguimiento como se lo haya indicado el mdico. Esto es importante.   No est cerca de personas que tengan herpes labial o ampollas febriles. Si se produce la infeccin, puede hacer que la dermatitis atpica empeore.  Comunquese con un mdico  si:   La picazn le impide dormir.   La erupcin empeora o no mejora en el plazo de una semana despus de iniciar el tratamiento.   Tiene fiebre.   Aparece un brote despus de estar en contacto con alguien que tiene herpes labial o ampollas febriles.  Solicite ayuda de inmediato si:   Tiene pus o costras amarillas en la zona de la erupcin.  Resumen   Esta afeccin causa una erupcin roja que pica, y la piel est seca y escamosa.   El tratamiento se enfoca en controlar la picazn y el rascado, limitar la exposicin a cosas a las que es sensible o alrgico (alrgenos), reconocer situaciones que causan estrs e idear un plan para manejar el estrs.   Mantenga la piel bien humectada.   Tome baos o duchas de menos de 5 minutos y use agua tibia. No use agua caliente.  Esta informacin no tiene como fin reemplazar el consejo del mdico. Asegrese de hacerle al mdico cualquier pregunta que tenga.  Document Released: 01/08/2005 Document Revised: 04/30/2016 Document Reviewed: 04/30/2016  Elsevier Interactive Patient Education  2018 [employer]

## 2017-05-21 ENCOUNTER — Ambulatory Visit (INDEPENDENT_AMBULATORY_CARE_PROVIDER_SITE_OTHER): Payer: Medicaid Other | Admitting: Physician Assistant

## 2018-08-31 IMAGING — CT CT HEAD W/O CM
4 series · 17 of 47 positions shown, 19 images · non-contrast
Comparison: None.

CLINICAL DATA: Headache and right-sided numbness

EXAM:
CT HEAD WITHOUT CONTRAST
TECHNIQUE: Contiguous axial images were obtained from the base of the skull
through the vertex without intravenous contrast.

[Series 3: head bone · axial · 0.45mm/px · z∈[-67,-11]mm · 4 of 79 slices shown]
[im 8/79  bone]
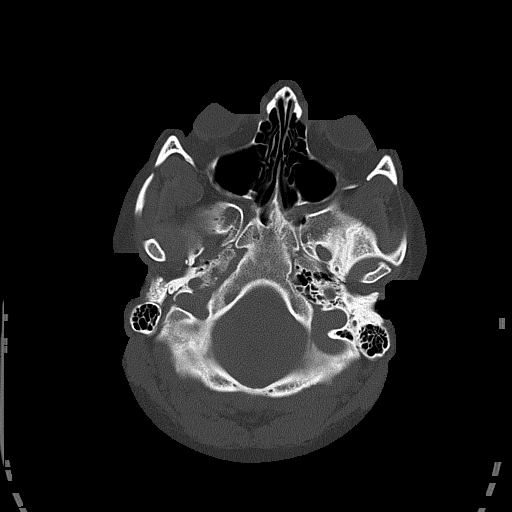
[im 16/79  bone]
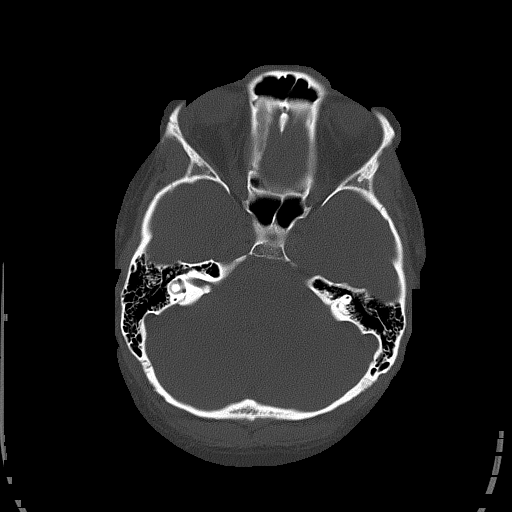
[im 24/79  bone]
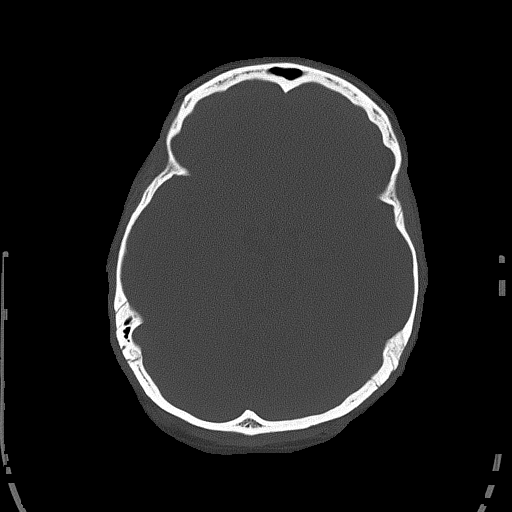
[im 36/79  bone]
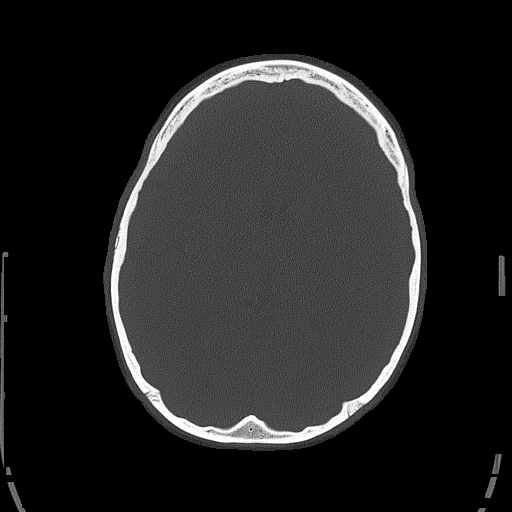

[Series 4: head without · axial · non-contrast · 0.45mm/px · z∈[-66,+54]mm · 7 of 32 slices shown, 9 images]
[im 4/32  brain]
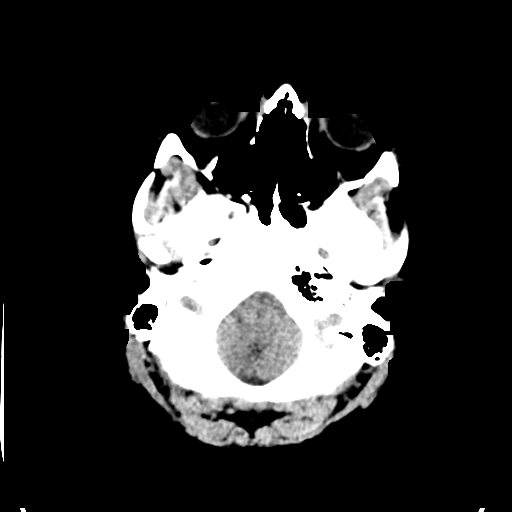
[im 4/32  bone]
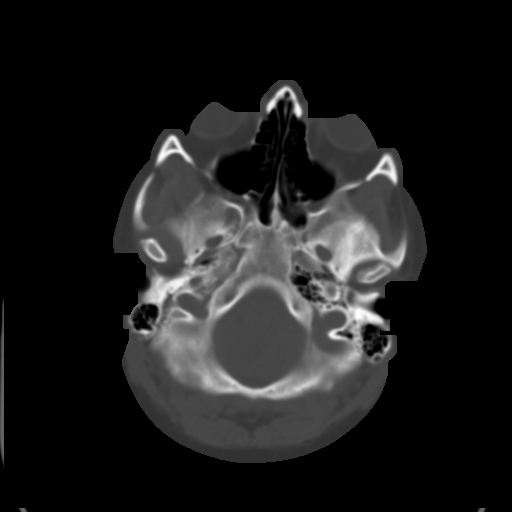
[im 8/32  brain]
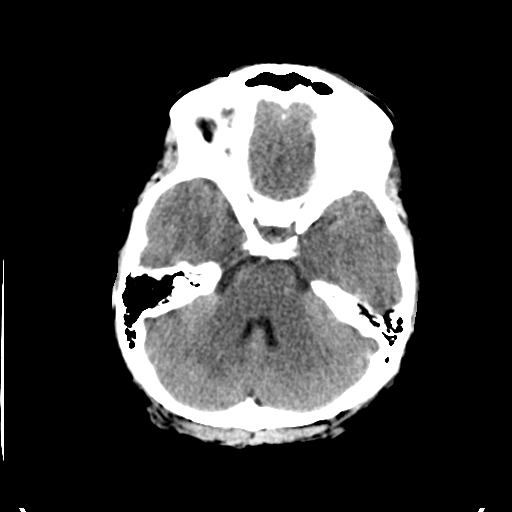
[im 12/32  brain]
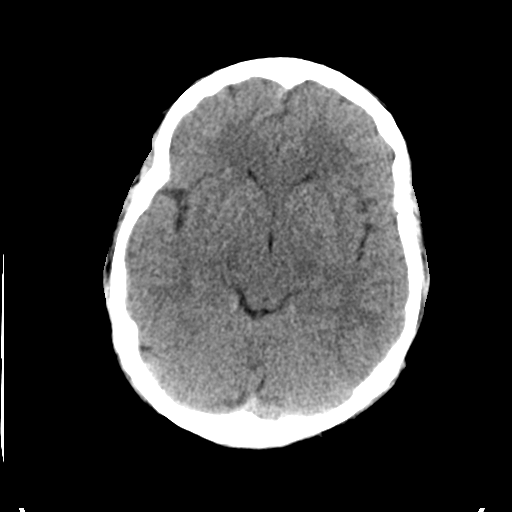
[im 16/32  brain]
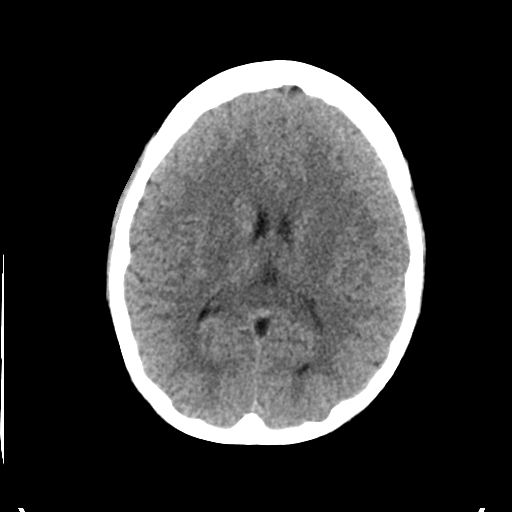
[im 20/32  brain]
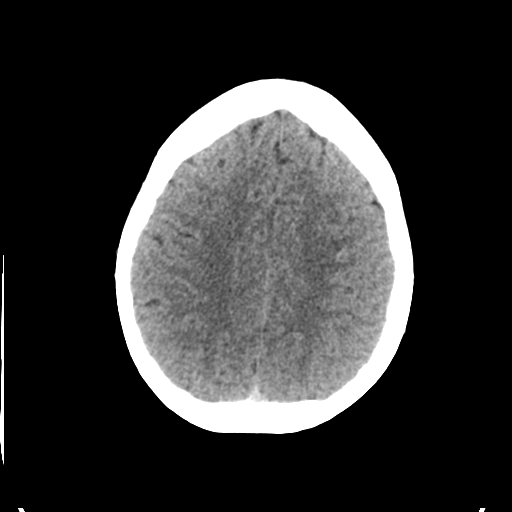
[im 20/32  bone]
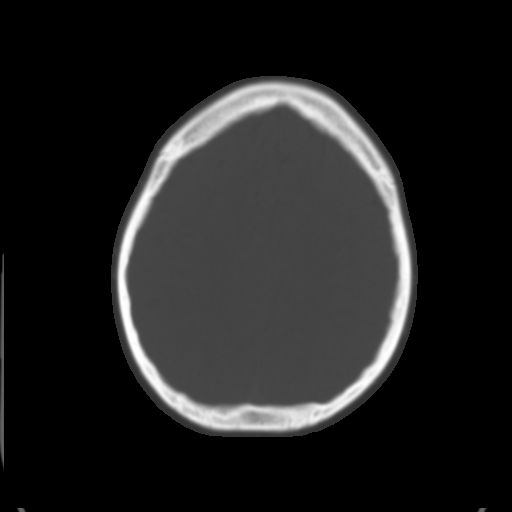
[im 24/32  brain]
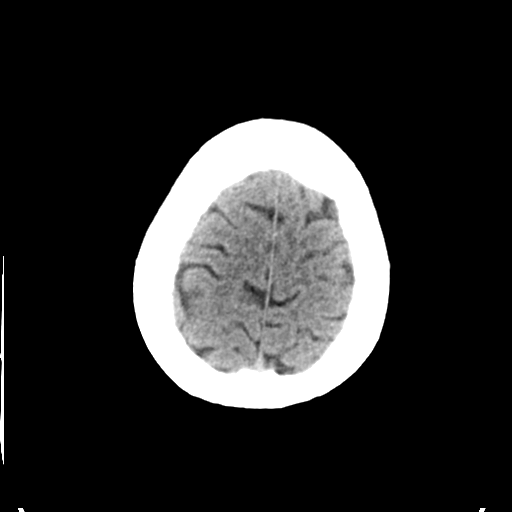
[im 28/32  brain]
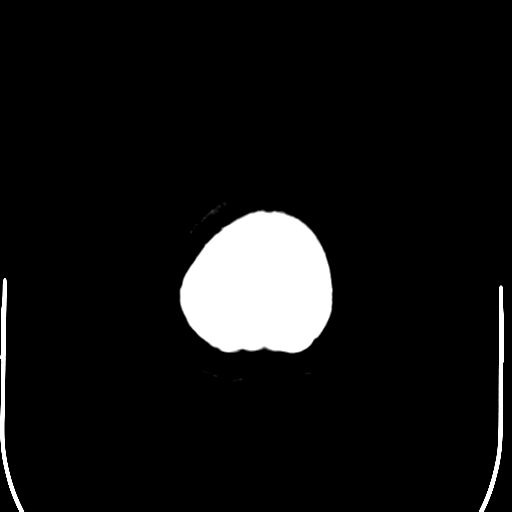

[Series 5: head without cor · coronal · non-contrast · 0.30mm/px · 3 of 67 slices shown]
[im 23/67  brain]
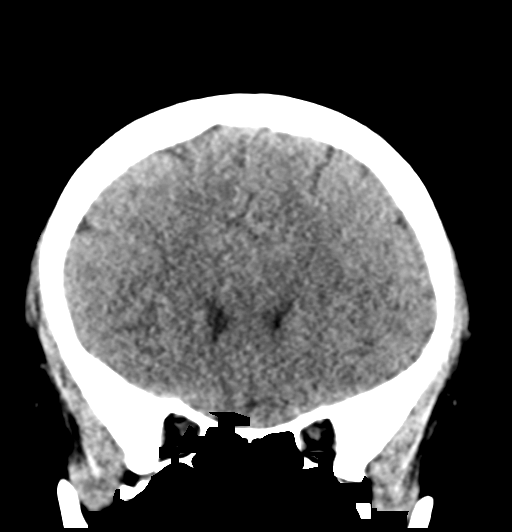
[im 30/67  brain]
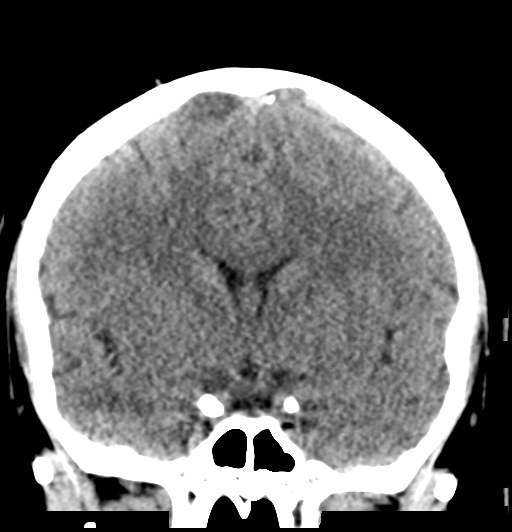
[im 37/67  brain]
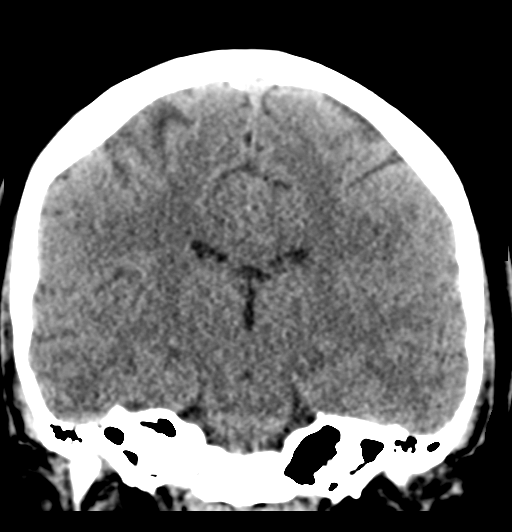

[Series 6: head without sag · sagittal · non-contrast · 0.31mm/px · 3 of 48 slices shown]
[im 16/48  brain]
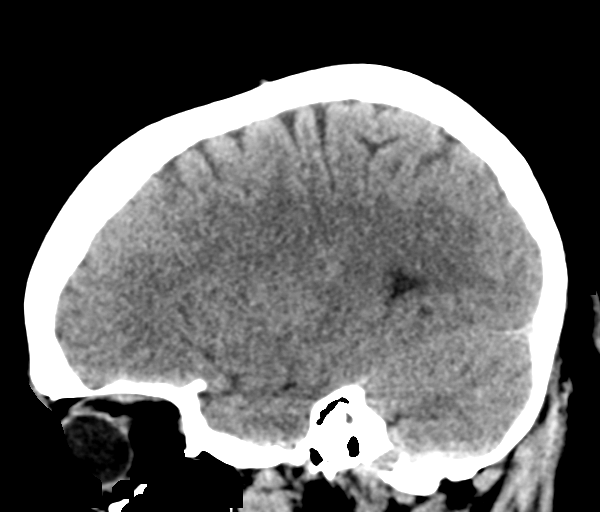
[im 24/48  brain]
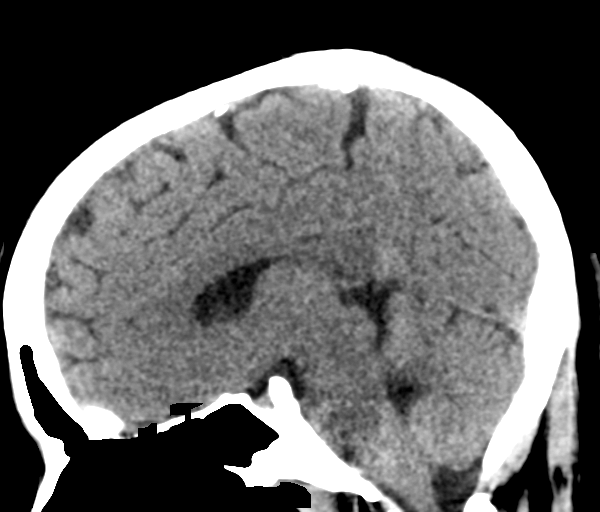
[im 32/48  brain]
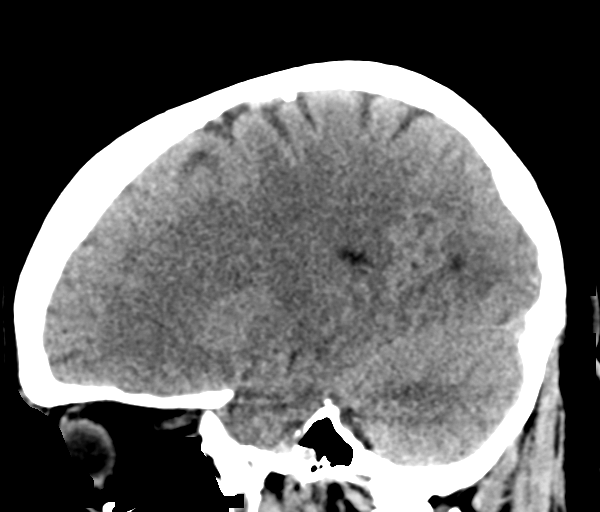

[17 of 47 positions shown; findings below may reference images not displayed]

FINDINGS: Brain: There is no evidence for acute hemorrhage, hydrocephalus,
mass lesion, or abnormal extra-axial fluid collection. No definite
CT evidence for acute infarction.

Vascular: No hyperdense vessel or unexpected calcification.

Skull: No evidence for fracture. No worrisome lytic or sclerotic
lesion.

Sinuses/Orbits: The visualized paranasal sinuses and mastoid air
cells are clear. Visualized portions of the globes and intraorbital
fat are unremarkable.

Other: None.
IMPRESSION: Normal CT evaluation of the brain.

## 2020-03-04 DIAGNOSIS — Z30011 Encounter for initial prescription of contraceptive pills: Secondary | ICD-10-CM | POA: Diagnosis not present

## 2020-03-04 DIAGNOSIS — Z01419 Encounter for gynecological examination (general) (routine) without abnormal findings: Secondary | ICD-10-CM | POA: Diagnosis not present

## 2020-03-04 DIAGNOSIS — Z3009 Encounter for other general counseling and advice on contraception: Secondary | ICD-10-CM | POA: Diagnosis not present

## 2020-03-04 DIAGNOSIS — Z0389 Encounter for observation for other suspected diseases and conditions ruled out: Secondary | ICD-10-CM | POA: Diagnosis not present

## 2020-03-04 DIAGNOSIS — Z3200 Encounter for pregnancy test, result unknown: Secondary | ICD-10-CM | POA: Diagnosis not present

## 2020-03-04 DIAGNOSIS — Z1388 Encounter for screening for disorder due to exposure to contaminants: Secondary | ICD-10-CM | POA: Diagnosis not present

## 2021-11-22 DIAGNOSIS — Z419 Encounter for procedure for purposes other than remedying health state, unspecified: Secondary | ICD-10-CM | POA: Diagnosis not present

## 2021-11-27 DIAGNOSIS — E668 Other obesity: Secondary | ICD-10-CM | POA: Diagnosis not present

## 2021-11-27 DIAGNOSIS — Z3689 Encounter for other specified antenatal screening: Secondary | ICD-10-CM | POA: Diagnosis not present

## 2021-11-27 DIAGNOSIS — O99211 Obesity complicating pregnancy, first trimester: Secondary | ICD-10-CM | POA: Diagnosis not present

## 2021-11-27 DIAGNOSIS — O99210 Obesity complicating pregnancy, unspecified trimester: Secondary | ICD-10-CM | POA: Diagnosis not present

## 2021-11-27 DIAGNOSIS — Z7982 Long term (current) use of aspirin: Secondary | ICD-10-CM | POA: Diagnosis not present

## 2021-11-27 DIAGNOSIS — Z3A09 9 weeks gestation of pregnancy: Secondary | ICD-10-CM | POA: Diagnosis not present

## 2021-12-21 DIAGNOSIS — Z3A13 13 weeks gestation of pregnancy: Secondary | ICD-10-CM | POA: Diagnosis not present

## 2021-12-21 DIAGNOSIS — O99211 Obesity complicating pregnancy, first trimester: Secondary | ICD-10-CM | POA: Diagnosis not present

## 2021-12-21 DIAGNOSIS — O219 Vomiting of pregnancy, unspecified: Secondary | ICD-10-CM | POA: Diagnosis not present

## 2021-12-22 DIAGNOSIS — Z419 Encounter for procedure for purposes other than remedying health state, unspecified: Secondary | ICD-10-CM | POA: Diagnosis not present

## 2022-01-22 DIAGNOSIS — Z419 Encounter for procedure for purposes other than remedying health state, unspecified: Secondary | ICD-10-CM | POA: Diagnosis not present

## 2022-02-06 DIAGNOSIS — Z3689 Encounter for other specified antenatal screening: Secondary | ICD-10-CM | POA: Diagnosis not present

## 2022-02-06 DIAGNOSIS — O99212 Obesity complicating pregnancy, second trimester: Secondary | ICD-10-CM | POA: Diagnosis not present

## 2022-02-06 DIAGNOSIS — E669 Obesity, unspecified: Secondary | ICD-10-CM | POA: Diagnosis not present

## 2022-02-06 DIAGNOSIS — O321XX0 Maternal care for breech presentation, not applicable or unspecified: Secondary | ICD-10-CM | POA: Diagnosis not present

## 2022-02-06 DIAGNOSIS — Z3A20 20 weeks gestation of pregnancy: Secondary | ICD-10-CM | POA: Diagnosis not present

## 2022-02-22 DIAGNOSIS — Z419 Encounter for procedure for purposes other than remedying health state, unspecified: Secondary | ICD-10-CM | POA: Diagnosis not present

## 2022-03-07 DIAGNOSIS — Z3689 Encounter for other specified antenatal screening: Secondary | ICD-10-CM | POA: Diagnosis not present

## 2022-03-07 DIAGNOSIS — Z362 Encounter for other antenatal screening follow-up: Secondary | ICD-10-CM | POA: Diagnosis not present

## 2022-03-07 DIAGNOSIS — O99212 Obesity complicating pregnancy, second trimester: Secondary | ICD-10-CM | POA: Diagnosis not present

## 2022-03-07 DIAGNOSIS — Z3A24 24 weeks gestation of pregnancy: Secondary | ICD-10-CM | POA: Diagnosis not present

## 2022-03-12 DIAGNOSIS — O99212 Obesity complicating pregnancy, second trimester: Secondary | ICD-10-CM | POA: Diagnosis not present

## 2022-03-12 DIAGNOSIS — O26892 Other specified pregnancy related conditions, second trimester: Secondary | ICD-10-CM | POA: Diagnosis not present

## 2022-03-12 DIAGNOSIS — Z3A24 24 weeks gestation of pregnancy: Secondary | ICD-10-CM | POA: Diagnosis not present

## 2022-03-12 DIAGNOSIS — N898 Other specified noninflammatory disorders of vagina: Secondary | ICD-10-CM | POA: Diagnosis not present

## 2022-03-12 DIAGNOSIS — L292 Pruritus vulvae: Secondary | ICD-10-CM | POA: Diagnosis not present

## 2022-03-12 DIAGNOSIS — O99891 Other specified diseases and conditions complicating pregnancy: Secondary | ICD-10-CM | POA: Diagnosis not present

## 2022-03-23 DIAGNOSIS — Z419 Encounter for procedure for purposes other than remedying health state, unspecified: Secondary | ICD-10-CM | POA: Diagnosis not present

## 2022-04-04 DIAGNOSIS — O99213 Obesity complicating pregnancy, third trimester: Secondary | ICD-10-CM | POA: Diagnosis not present

## 2022-04-04 DIAGNOSIS — Z3A28 28 weeks gestation of pregnancy: Secondary | ICD-10-CM | POA: Diagnosis not present

## 2022-04-13 DIAGNOSIS — O99213 Obesity complicating pregnancy, third trimester: Secondary | ICD-10-CM | POA: Diagnosis not present

## 2022-04-13 DIAGNOSIS — Z3493 Encounter for supervision of normal pregnancy, unspecified, third trimester: Secondary | ICD-10-CM | POA: Diagnosis not present

## 2022-04-13 DIAGNOSIS — O99210 Obesity complicating pregnancy, unspecified trimester: Secondary | ICD-10-CM | POA: Diagnosis not present

## 2022-04-13 DIAGNOSIS — Z3A29 29 weeks gestation of pregnancy: Secondary | ICD-10-CM | POA: Diagnosis not present

## 2022-04-23 DIAGNOSIS — Z419 Encounter for procedure for purposes other than remedying health state, unspecified: Secondary | ICD-10-CM | POA: Diagnosis not present

## 2022-05-04 DIAGNOSIS — O99213 Obesity complicating pregnancy, third trimester: Secondary | ICD-10-CM | POA: Diagnosis not present

## 2022-05-04 DIAGNOSIS — Z3689 Encounter for other specified antenatal screening: Secondary | ICD-10-CM | POA: Diagnosis not present

## 2022-05-04 DIAGNOSIS — O321XX0 Maternal care for breech presentation, not applicable or unspecified: Secondary | ICD-10-CM | POA: Diagnosis not present

## 2022-05-04 DIAGNOSIS — Z3A32 32 weeks gestation of pregnancy: Secondary | ICD-10-CM | POA: Diagnosis not present

## 2022-05-04 DIAGNOSIS — O24410 Gestational diabetes mellitus in pregnancy, diet controlled: Secondary | ICD-10-CM | POA: Diagnosis not present

## 2022-05-14 DIAGNOSIS — O99891 Other specified diseases and conditions complicating pregnancy: Secondary | ICD-10-CM | POA: Diagnosis not present

## 2022-05-14 DIAGNOSIS — M549 Dorsalgia, unspecified: Secondary | ICD-10-CM | POA: Diagnosis not present

## 2022-05-14 DIAGNOSIS — O24414 Gestational diabetes mellitus in pregnancy, insulin controlled: Secondary | ICD-10-CM | POA: Diagnosis not present

## 2022-05-14 DIAGNOSIS — O99213 Obesity complicating pregnancy, third trimester: Secondary | ICD-10-CM | POA: Diagnosis not present

## 2022-05-14 DIAGNOSIS — O26893 Other specified pregnancy related conditions, third trimester: Secondary | ICD-10-CM | POA: Diagnosis not present

## 2022-05-14 DIAGNOSIS — O24410 Gestational diabetes mellitus in pregnancy, diet controlled: Secondary | ICD-10-CM | POA: Diagnosis not present

## 2022-05-14 DIAGNOSIS — N644 Mastodynia: Secondary | ICD-10-CM | POA: Diagnosis not present

## 2022-05-14 DIAGNOSIS — Z3A33 33 weeks gestation of pregnancy: Secondary | ICD-10-CM | POA: Diagnosis not present

## 2022-05-14 DIAGNOSIS — R0781 Pleurodynia: Secondary | ICD-10-CM | POA: Diagnosis not present

## 2022-05-16 DIAGNOSIS — O0993 Supervision of high risk pregnancy, unspecified, third trimester: Secondary | ICD-10-CM | POA: Diagnosis not present

## 2022-05-16 DIAGNOSIS — O24410 Gestational diabetes mellitus in pregnancy, diet controlled: Secondary | ICD-10-CM | POA: Diagnosis not present

## 2022-05-21 DIAGNOSIS — O24414 Gestational diabetes mellitus in pregnancy, insulin controlled: Secondary | ICD-10-CM | POA: Diagnosis not present

## 2022-05-21 DIAGNOSIS — O99213 Obesity complicating pregnancy, third trimester: Secondary | ICD-10-CM | POA: Diagnosis not present

## 2022-05-23 DIAGNOSIS — Z419 Encounter for procedure for purposes other than remedying health state, unspecified: Secondary | ICD-10-CM | POA: Diagnosis not present

## 2022-05-28 DIAGNOSIS — Z3483 Encounter for supervision of other normal pregnancy, third trimester: Secondary | ICD-10-CM | POA: Diagnosis not present

## 2022-05-28 DIAGNOSIS — O99213 Obesity complicating pregnancy, third trimester: Secondary | ICD-10-CM | POA: Diagnosis not present

## 2022-05-28 DIAGNOSIS — Z3482 Encounter for supervision of other normal pregnancy, second trimester: Secondary | ICD-10-CM | POA: Diagnosis not present

## 2022-05-28 DIAGNOSIS — O163 Unspecified maternal hypertension, third trimester: Secondary | ICD-10-CM | POA: Diagnosis not present

## 2022-05-28 DIAGNOSIS — Z3A35 35 weeks gestation of pregnancy: Secondary | ICD-10-CM | POA: Diagnosis not present

## 2022-05-28 DIAGNOSIS — O24414 Gestational diabetes mellitus in pregnancy, insulin controlled: Secondary | ICD-10-CM | POA: Diagnosis not present

## 2022-06-04 DIAGNOSIS — O24424 Gestational diabetes mellitus in childbirth, insulin controlled: Secondary | ICD-10-CM | POA: Diagnosis not present

## 2022-06-04 DIAGNOSIS — O99212 Obesity complicating pregnancy, second trimester: Secondary | ICD-10-CM | POA: Diagnosis not present

## 2022-06-04 DIAGNOSIS — Z3A36 36 weeks gestation of pregnancy: Secondary | ICD-10-CM | POA: Diagnosis not present

## 2022-06-06 DIAGNOSIS — O24414 Gestational diabetes mellitus in pregnancy, insulin controlled: Secondary | ICD-10-CM | POA: Diagnosis not present

## 2022-06-06 DIAGNOSIS — Z3A37 37 weeks gestation of pregnancy: Secondary | ICD-10-CM | POA: Diagnosis not present

## 2022-06-06 DIAGNOSIS — O0993 Supervision of high risk pregnancy, unspecified, third trimester: Secondary | ICD-10-CM | POA: Diagnosis not present

## 2022-06-11 DIAGNOSIS — Z3A37 37 weeks gestation of pregnancy: Secondary | ICD-10-CM | POA: Diagnosis not present

## 2022-06-11 DIAGNOSIS — O24414 Gestational diabetes mellitus in pregnancy, insulin controlled: Secondary | ICD-10-CM | POA: Diagnosis not present

## 2022-06-11 DIAGNOSIS — Z7689 Persons encountering health services in other specified circumstances: Secondary | ICD-10-CM | POA: Diagnosis not present

## 2022-06-15 DIAGNOSIS — O99214 Obesity complicating childbirth: Secondary | ICD-10-CM | POA: Diagnosis not present

## 2022-06-15 DIAGNOSIS — Z793 Long term (current) use of hormonal contraceptives: Secondary | ICD-10-CM | POA: Diagnosis not present

## 2022-06-15 DIAGNOSIS — O24424 Gestational diabetes mellitus in childbirth, insulin controlled: Secondary | ICD-10-CM | POA: Diagnosis not present

## 2022-06-15 DIAGNOSIS — O134 Gestational [pregnancy-induced] hypertension without significant proteinuria, complicating childbirth: Secondary | ICD-10-CM | POA: Diagnosis not present

## 2022-06-15 DIAGNOSIS — O99213 Obesity complicating pregnancy, third trimester: Secondary | ICD-10-CM | POA: Diagnosis not present

## 2022-06-15 DIAGNOSIS — Z7689 Persons encountering health services in other specified circumstances: Secondary | ICD-10-CM | POA: Diagnosis not present

## 2022-06-15 DIAGNOSIS — O24414 Gestational diabetes mellitus in pregnancy, insulin controlled: Secondary | ICD-10-CM | POA: Diagnosis not present

## 2022-06-15 DIAGNOSIS — Z3A38 38 weeks gestation of pregnancy: Secondary | ICD-10-CM | POA: Diagnosis not present

## 2022-06-15 DIAGNOSIS — O133 Gestational [pregnancy-induced] hypertension without significant proteinuria, third trimester: Secondary | ICD-10-CM | POA: Diagnosis not present

## 2022-06-15 DIAGNOSIS — Z7982 Long term (current) use of aspirin: Secondary | ICD-10-CM | POA: Diagnosis not present

## 2022-06-15 DIAGNOSIS — O24913 Unspecified diabetes mellitus in pregnancy, third trimester: Secondary | ICD-10-CM | POA: Diagnosis not present

## 2022-06-15 DIAGNOSIS — O333XX0 Maternal care for disproportion due to outlet contraction of pelvis, not applicable or unspecified: Secondary | ICD-10-CM | POA: Diagnosis not present

## 2022-06-16 DIAGNOSIS — O333XX0 Maternal care for disproportion due to outlet contraction of pelvis, not applicable or unspecified: Secondary | ICD-10-CM | POA: Diagnosis not present

## 2022-06-16 DIAGNOSIS — Z3A38 38 weeks gestation of pregnancy: Secondary | ICD-10-CM | POA: Diagnosis not present

## 2022-06-16 DIAGNOSIS — O24429 Gestational diabetes mellitus in childbirth, unspecified control: Secondary | ICD-10-CM | POA: Diagnosis not present

## 2022-06-16 DIAGNOSIS — O134 Gestational [pregnancy-induced] hypertension without significant proteinuria, complicating childbirth: Secondary | ICD-10-CM | POA: Diagnosis not present

## 2022-06-20 DIAGNOSIS — Z0130 Encounter for examination of blood pressure without abnormal findings: Secondary | ICD-10-CM | POA: Diagnosis not present

## 2022-06-23 DIAGNOSIS — Z419 Encounter for procedure for purposes other than remedying health state, unspecified: Secondary | ICD-10-CM | POA: Diagnosis not present

## 2022-07-23 DIAGNOSIS — Z419 Encounter for procedure for purposes other than remedying health state, unspecified: Secondary | ICD-10-CM | POA: Diagnosis not present

## 2022-08-23 DIAGNOSIS — Z419 Encounter for procedure for purposes other than remedying health state, unspecified: Secondary | ICD-10-CM | POA: Diagnosis not present

## 2022-09-23 DIAGNOSIS — Z419 Encounter for procedure for purposes other than remedying health state, unspecified: Secondary | ICD-10-CM | POA: Diagnosis not present

## 2022-10-23 DIAGNOSIS — Z419 Encounter for procedure for purposes other than remedying health state, unspecified: Secondary | ICD-10-CM | POA: Diagnosis not present

## 2022-11-23 DIAGNOSIS — Z419 Encounter for procedure for purposes other than remedying health state, unspecified: Secondary | ICD-10-CM | POA: Diagnosis not present

## 2022-12-04 DIAGNOSIS — B9689 Other specified bacterial agents as the cause of diseases classified elsewhere: Secondary | ICD-10-CM | POA: Diagnosis not present

## 2022-12-04 DIAGNOSIS — R062 Wheezing: Secondary | ICD-10-CM | POA: Diagnosis not present

## 2022-12-04 DIAGNOSIS — J208 Acute bronchitis due to other specified organisms: Secondary | ICD-10-CM | POA: Diagnosis not present

## 2022-12-04 DIAGNOSIS — R051 Acute cough: Secondary | ICD-10-CM | POA: Diagnosis not present

## 2022-12-23 DIAGNOSIS — Z419 Encounter for procedure for purposes other than remedying health state, unspecified: Secondary | ICD-10-CM | POA: Diagnosis not present

## 2023-01-03 DIAGNOSIS — N926 Irregular menstruation, unspecified: Secondary | ICD-10-CM | POA: Diagnosis not present

## 2023-01-23 DIAGNOSIS — Z419 Encounter for procedure for purposes other than remedying health state, unspecified: Secondary | ICD-10-CM | POA: Diagnosis not present

## 2023-02-23 DIAGNOSIS — Z419 Encounter for procedure for purposes other than remedying health state, unspecified: Secondary | ICD-10-CM | POA: Diagnosis not present

## 2023-03-23 DIAGNOSIS — Z419 Encounter for procedure for purposes other than remedying health state, unspecified: Secondary | ICD-10-CM | POA: Diagnosis not present

## 2023-05-04 DIAGNOSIS — Z419 Encounter for procedure for purposes other than remedying health state, unspecified: Secondary | ICD-10-CM | POA: Diagnosis not present

## 2023-05-16 ENCOUNTER — Ambulatory Visit (INDEPENDENT_AMBULATORY_CARE_PROVIDER_SITE_OTHER): Admitting: Obstetrics and Gynecology

## 2023-05-16 ENCOUNTER — Encounter: Payer: Self-pay | Admitting: Obstetrics and Gynecology

## 2023-05-16 VITALS — BP 115/81 | HR 79 | Ht 65.0 in | Wt 216.2 lb

## 2023-05-16 DIAGNOSIS — R102 Pelvic and perineal pain: Secondary | ICD-10-CM

## 2023-05-16 DIAGNOSIS — E669 Obesity, unspecified: Secondary | ICD-10-CM | POA: Diagnosis not present

## 2023-05-16 DIAGNOSIS — Z98891 History of uterine scar from previous surgery: Secondary | ICD-10-CM

## 2023-05-16 DIAGNOSIS — R1030 Lower abdominal pain, unspecified: Secondary | ICD-10-CM

## 2023-05-16 DIAGNOSIS — Z8632 Personal history of gestational diabetes: Secondary | ICD-10-CM

## 2023-05-16 MED ORDER — CYCLOBENZAPRINE HCL 10 MG PO TABS: 10.0000 mg | ORAL_TABLET | Freq: Three times a day (TID) | ORAL | 0 refills | Status: AC | PRN

## 2023-05-16 NOTE — Progress Notes (Signed)
 Last PAP 2024. No abnormal   C-Section pain. Incision is healed. Pain occurring the past month.   Would also like to discuss her weight.

## 2023-05-16 NOTE — Progress Notes (Signed)
   GYNECOLOGY PROGRESS NOTE  History:  33 y.o. G1P1001 presents to [facility] for problem visit. She had a baby via c/s in June 2024. In the past month has been experiencing pain near incision site  Moving a lot of things during the day, and then at night when trying to rest  and move position, whether she is getting up or turning in bed, reports pain and has heard a popping sensation. She repors when she rests and goes to get up it feels like her abdomen is "locked" and hard to get up. During day she feels abdomen gets swollen, no burning or tingling. Reports pain feels like a stretching pain. Denies constipation, denies urinary problems, denies pelvic or vaginal pain. Does endorse it felt like gas pain yesterday. Denies fever. Reports the area is tender with palpation, even up to belly button. She endorses weight gain despite her efforts of fasting/dieting, exercising. She has felt more bloated. Had GDM controlled with insulin during pregnancy.  Desires blood work as well   The following portions of the patient's history were reviewed and updated as appropriate: allergies, current medications, past family history, past medical history, past social history, past surgical history and problem list. Last pap smear on 2024 was normal  Health Maintenance Due  Topic Date Due   HIV Screening  Never done   Hepatitis C Screening  Never done   DTaP/Tdap/Td (1 - Tdap) Never done   COVID-19 Vaccine (1 - 2024-25 season) Never done     Review of Systems:  Pertinent items are noted in HPI.   Objective:  Physical Exam Blood pressure 115/81, pulse 79, height 5\' 5"  (1.651 m), weight 216 lb 3.2 oz (98.1 kg). VS reviewed, nursing note reviewed,  Constitutional: well developed, well nourished, no distress HEENT: normocephalic Pulm/chest wall: normal effort Breast Exam: deferred Abdomen: soft, tender to palpate left lower quadrant at incision , periumbilical  Neuro: alert and oriented  Skin: warm,  dry Psych: affect normal Pelvic exam: deferred  Assessment & Plan:  1. Lower abdominal pain (Primary) Discussed etiology with c/s, expected symptoms,  based on symptoms will trial flexeril  Obtain u/s, follow up depending results   - cyclobenzaprine  (FLEXERIL ) 10 MG tablet; Take 1 tablet (10 mg total) by mouth every 8 (eight) hours as needed for muscle spasms.  Dispense: 30 tablet; Refill: 0 - US  PELVIC COMPLETE WITH TRANSVAGINAL; Future  2. S/P cesarean section   3. History of gestational diabetes 4. Obesity (BMI 35.0-39.9 without comorbidity) Desires blood work today,  would like to see dietician  - CBC - Comp Met (CMET) - HgB A1c - TSH Rfx on Abnormal to Free T4  5. Pelvic pain   Susi Eric, FNP

## 2023-05-17 ENCOUNTER — Ambulatory Visit (HOSPITAL_COMMUNITY): Admission: RE | Admit: 2023-05-17 | Source: Ambulatory Visit

## 2023-05-17 ENCOUNTER — Encounter (HOSPITAL_COMMUNITY): Payer: Self-pay

## 2023-05-17 LAB — COMPREHENSIVE METABOLIC PANEL WITH GFR
ALT: 19 IU/L (ref 0–32)
AST: 17 IU/L (ref 0–40)
Albumin: 4.5 g/dL (ref 4.0–5.0)
Alkaline Phosphatase: 86 IU/L (ref 44–121)
BUN/Creatinine Ratio: 13 (ref 9–23)
BUN: 9 mg/dL (ref 6–20)
Bilirubin Total: 0.9 mg/dL (ref 0.0–1.2)
CO2: 20 mmol/L (ref 20–29)
Calcium: 9.2 mg/dL (ref 8.7–10.2)
Chloride: 102 mmol/L (ref 96–106)
Creatinine, Ser: 0.72 mg/dL (ref 0.57–1.00)
Globulin, Total: 2.8 g/dL (ref 1.5–4.5)
Glucose: 86 mg/dL (ref 70–99)
Potassium: 4 mmol/L (ref 3.5–5.2)
Sodium: 137 mmol/L (ref 134–144)
Total Protein: 7.3 g/dL (ref 6.0–8.5)
eGFR: 115 mL/min/{1.73_m2} (ref >59)

## 2023-05-17 LAB — CBC
Hematocrit: 39.5 % (ref 34.0–46.6)
Hemoglobin: 12.7 g/dL (ref 11.1–15.9)
MCH: 28.1 pg (ref 26.6–33.0)
MCHC: 32.2 g/dL (ref 31.5–35.7)
MCV: 87 fL (ref 79–97)
Platelets: 311 10*3/uL (ref 150–450)
RBC: 4.52 x10E6/uL (ref 3.77–5.28)
RDW: 13.4 % (ref 11.7–15.4)
WBC: 8 10*3/uL (ref 3.4–10.8)

## 2023-05-17 LAB — TSH RFX ON ABNORMAL TO FREE T4: TSH: 1.19 u[IU]/mL (ref 0.450–4.500)

## 2023-05-17 LAB — HEMOGLOBIN A1C
Est. average glucose Bld gHb Est-mCnc: 111 mg/dL
Hgb A1c MFr Bld: 5.5 % (ref 4.8–5.6)

## 2023-05-18 ENCOUNTER — Ambulatory Visit (HOSPITAL_COMMUNITY)
Admission: RE | Admit: 2023-05-18 | Discharge: 2023-05-18 | Disposition: A | Discharge status: Home / Self Care | Source: Ambulatory Visit | Attending: Obstetrics and Gynecology | Admitting: Obstetrics and Gynecology

## 2023-05-18 DIAGNOSIS — R102 Pelvic and perineal pain: Secondary | ICD-10-CM | POA: Diagnosis not present

## 2023-05-18 DIAGNOSIS — N888 Other specified noninflammatory disorders of cervix uteri: Secondary | ICD-10-CM | POA: Diagnosis not present

## 2023-05-18 DIAGNOSIS — R1030 Lower abdominal pain, unspecified: Secondary | ICD-10-CM | POA: Insufficient documentation

## 2023-05-19 DIAGNOSIS — Z98891 History of uterine scar from previous surgery: Secondary | ICD-10-CM | POA: Insufficient documentation

## 2023-06-03 DIAGNOSIS — Z419 Encounter for procedure for purposes other than remedying health state, unspecified: Secondary | ICD-10-CM | POA: Diagnosis not present

## 2023-07-04 DIAGNOSIS — Z419 Encounter for procedure for purposes other than remedying health state, unspecified: Secondary | ICD-10-CM | POA: Diagnosis not present
